# Patient Record
Sex: Male | Born: 1955 | Race: White | Hispanic: No
Health system: Southern US, Community
[De-identification: ages and names within clinical notes are randomized; demographics above are authoritative.]

---

## 2018-11-12 ENCOUNTER — Other Ambulatory Visit: Payer: Self-pay

## 2018-11-12 DIAGNOSIS — Z8249 Family history of ischemic heart disease and other diseases of the circulatory system: Secondary | ICD-10-CM

## 2018-11-12 NOTE — Progress Notes (Signed)
Ordered CT calcium score. Dr. Eden EmmsNishan to read and call results to Dr. Gwen PoundsKowalski.

## 2018-11-21 ENCOUNTER — Ambulatory Visit (INDEPENDENT_AMBULATORY_CARE_PROVIDER_SITE_OTHER)
Admission: RE | Admit: 2018-11-21 | Discharge: 2018-11-21 | Disposition: A | Discharge status: Home / Self Care | Payer: Self-pay | Source: Ambulatory Visit | Attending: Cardiovascular Disease | Admitting: Cardiovascular Disease

## 2018-11-21 DIAGNOSIS — Z8249 Family history of ischemic heart disease and other diseases of the circulatory system: Secondary | ICD-10-CM

## 2020-02-01 IMAGING — CT CT HEART SCORING
2 series · 16 of 20 positions shown, 18 images · non-contrast
Comparison: None.

Addendum:
EXAM:
OVER-READ INTERPRETATION  CT CHEST

The following report is an over-read performed by radiologist Dr.
over-read does not include interpretation of cardiac or coronary
anatomy or pathology. The calcium score interpretation by the
cardiologist is attached.
CLINICAL DATA: Risk stratification
Coronary Calcium Score
TECHNIQUE: The patient was scanned on a Siemens Somatom 64 slice scanner. Axial
non-contrast 3 mm slices were carried out through the heart. The
data set was analyzed on a dedicated work station and scored using
the Agatson method.

[Series 2: casc 3.0 i36f 2 bestdiast 68 % · axial · 0.37mm/px · z∈[-281,-158]mm · 8 of 53 slices shown, 10 images]
[im 6/53  vessel]
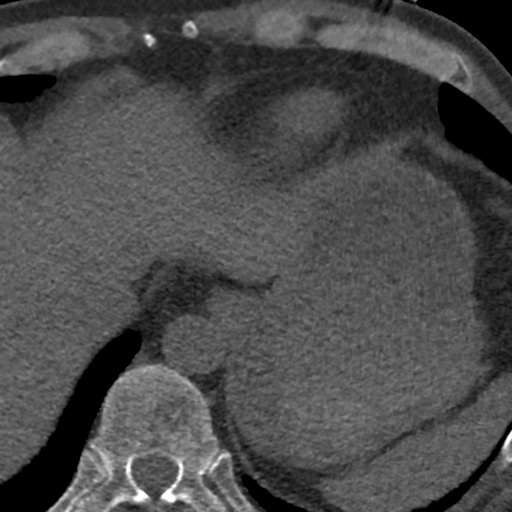
[im 6/53  lung]
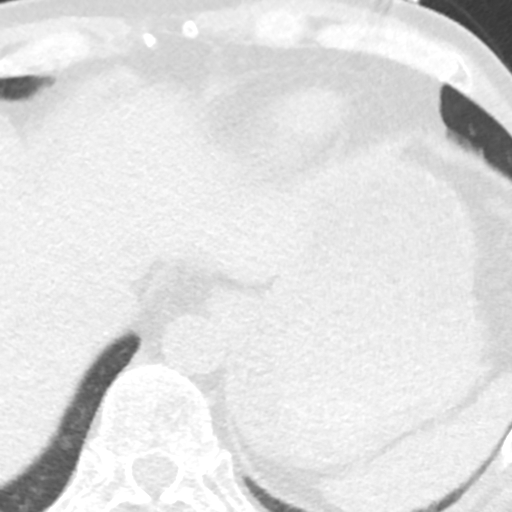
[im 12/53  vessel]
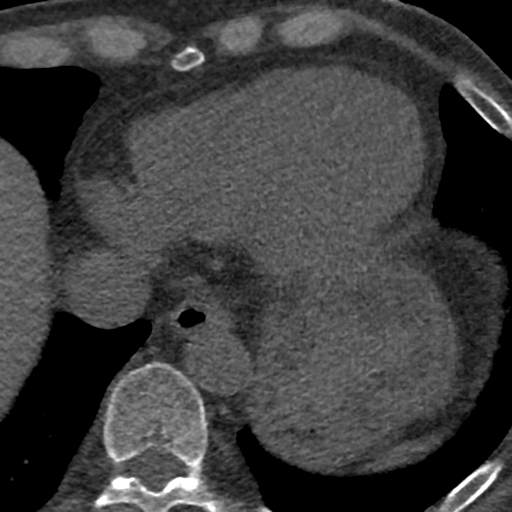
[im 18/53  vessel]
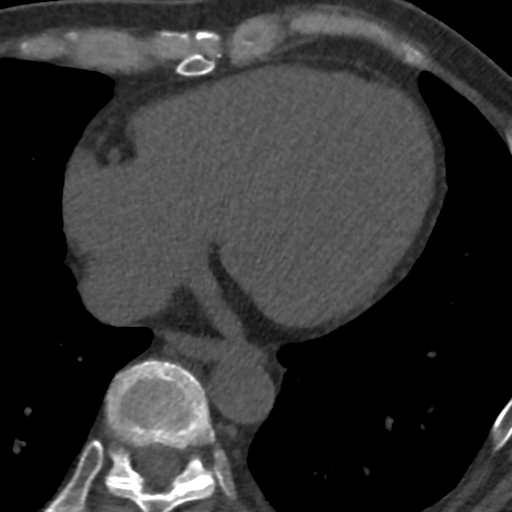
[im 24/53  vessel]
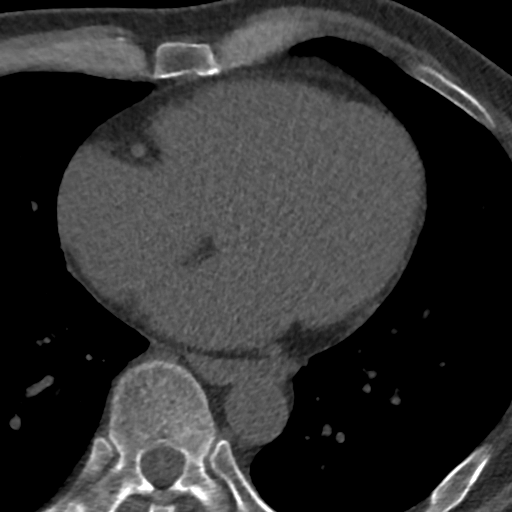
[im 29/53  vessel]
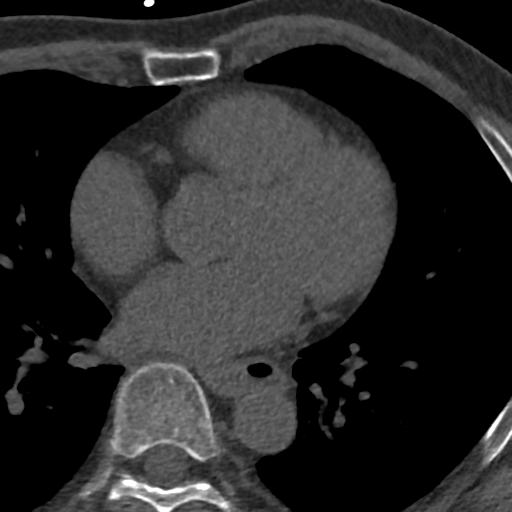
[im 29/53  lung]
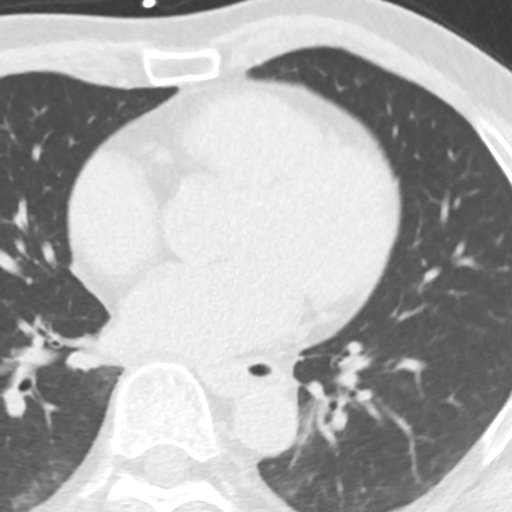
[im 35/53  vessel]
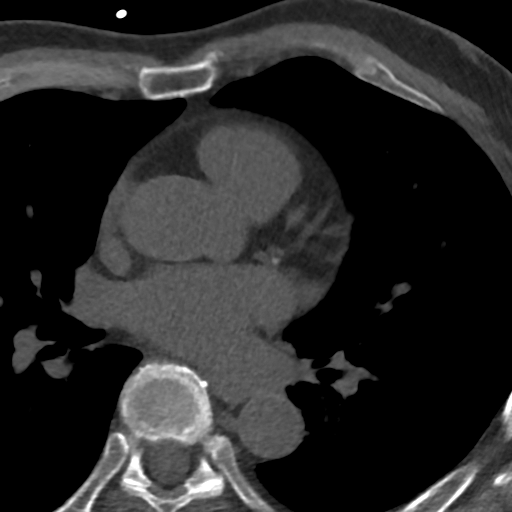
[im 41/53  vessel]
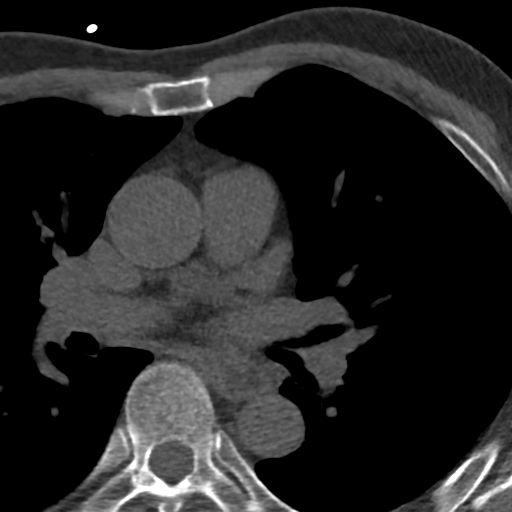
[im 47/53  vessel]
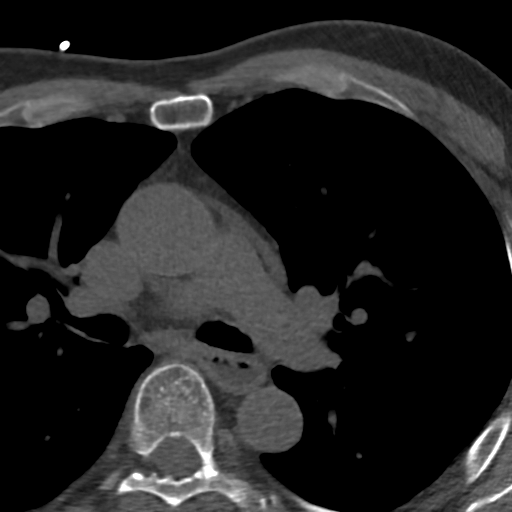

[Series 4: lung st 69 % · axial · 0.68mm/px · z∈[-281,-158]mm · 8 of 53 slices shown]
[im 6/53  lung]
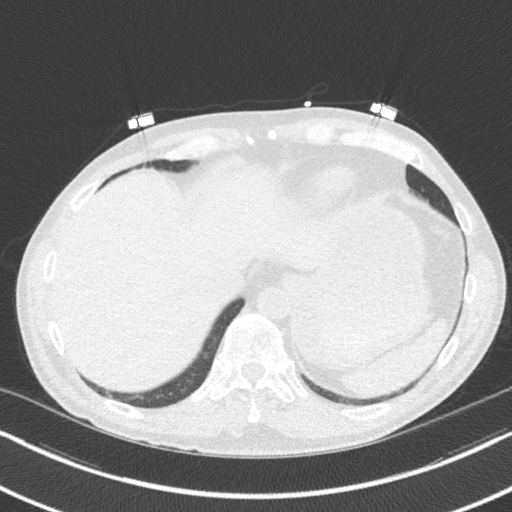
[im 12/53  lung]
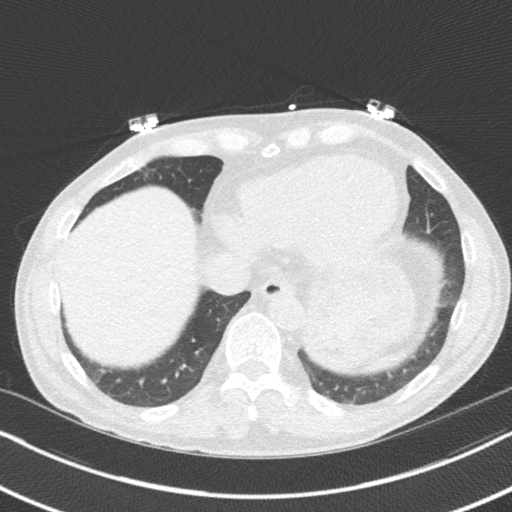
[im 18/53  lung]
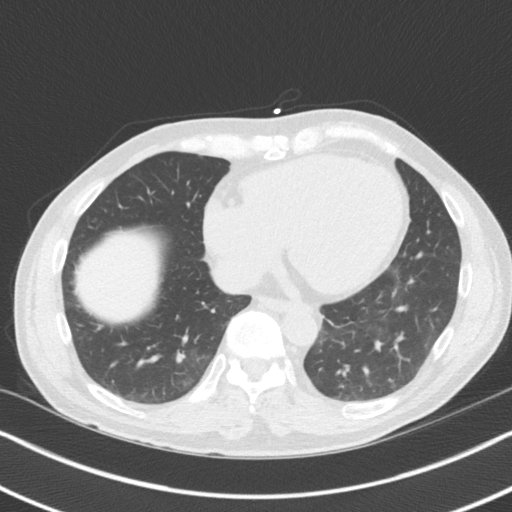
[im 24/53  lung]
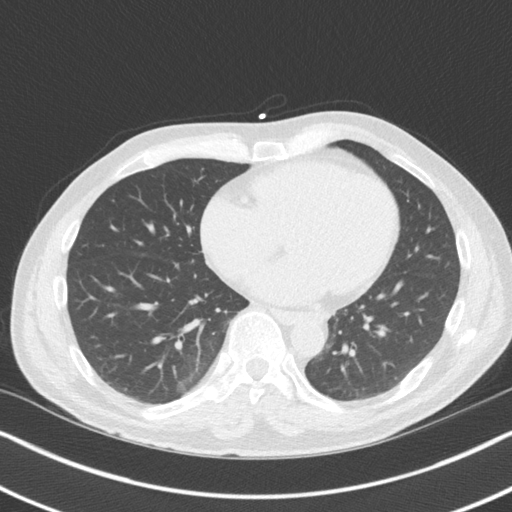
[im 29/53  lung]
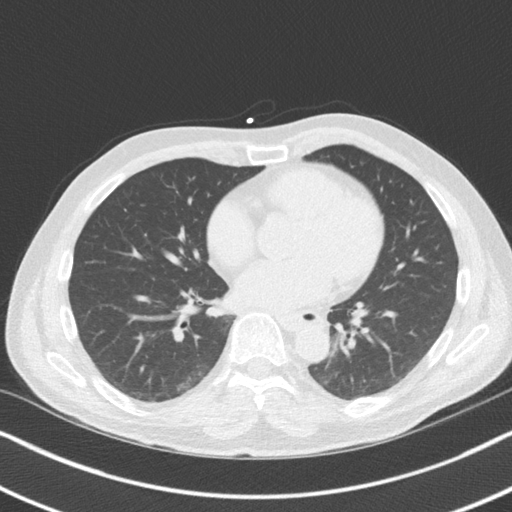
[im 35/53  lung]
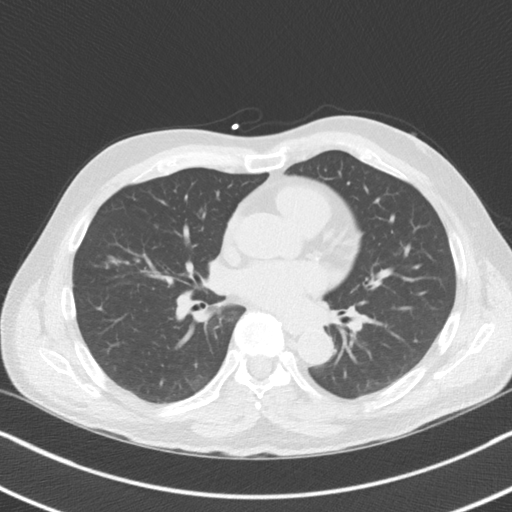
[im 41/53  lung]
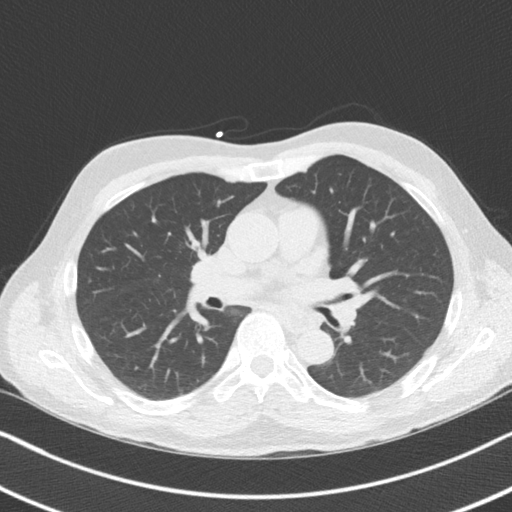
[im 47/53  lung]
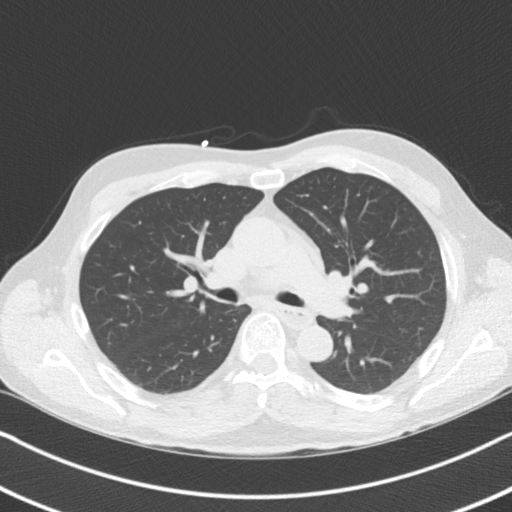

[16 of 20 positions shown; findings below may reference images not displayed]

FINDINGS: Vascular: Aortic atherosclerosis.

Mediastinum/Nodes: No imaged thoracic adenopathy. The esophagus is
mildly dilated with fluid level within, including on image [DATE].

Lungs/Pleura: No imaged pleural fluid.

3 mm left lower lobe pulmonary nodule on image 40/3. Mild right
middle lobe scarring is evidenced by peribronchovascular
interstitial thickening.

Upper Abdomen: Normal imaged portions of the liver, spleen, stomach.

Musculoskeletal: Remote posterior left rib fractures.
IMPRESSION: 1.  No acute findings in the imaged extracardiac chest.
2. 3 mm left lower lobe pulmonary nodule. No follow-up needed if
patient is low-risk. Non-contrast chest CT can be considered in 12
months if patient is high-risk. This recommendation follows the
consensus statement: Guidelines for Management of Incidental
Pulmonary Nodules Detected on CT Images: From the [HOSPITAL]
3. Esophageal air fluid level suggests dysmotility or
gastroesophageal reflux.
4.  Aortic Atherosclerosis ([id number]-EGL.L).
FINDINGS: Non-cardiac: See separate report from [REDACTED].

Ascending aorta: Normal diameter 3.3 cm

Pericardium: Normal

Coronary arteries: Calcium noted in proximal LAD and circumflex
arteries
IMPRESSION: Coronary calcium score of 114. This was 65 th percentile for age and
sex matched control.

Mostyn Adu

*** End of Addendum ***

## 2021-09-09 ENCOUNTER — Other Ambulatory Visit: Payer: Self-pay

## 2021-09-09 ENCOUNTER — Ambulatory Visit (INDEPENDENT_AMBULATORY_CARE_PROVIDER_SITE_OTHER): Payer: Medicare Other | Admitting: Dermatology

## 2021-09-09 DIAGNOSIS — L814 Other melanin hyperpigmentation: Secondary | ICD-10-CM

## 2021-09-09 DIAGNOSIS — Z1283 Encounter for screening for malignant neoplasm of skin: Secondary | ICD-10-CM

## 2021-09-09 DIAGNOSIS — L821 Other seborrheic keratosis: Secondary | ICD-10-CM | POA: Diagnosis not present

## 2021-09-09 DIAGNOSIS — L820 Inflamed seborrheic keratosis: Secondary | ICD-10-CM | POA: Diagnosis not present

## 2021-09-09 DIAGNOSIS — L570 Actinic keratosis: Secondary | ICD-10-CM

## 2021-09-09 DIAGNOSIS — L578 Other skin changes due to chronic exposure to nonionizing radiation: Secondary | ICD-10-CM | POA: Diagnosis not present

## 2021-09-09 DIAGNOSIS — H61002 Unspecified perichondritis of left external ear: Secondary | ICD-10-CM | POA: Diagnosis not present

## 2021-09-09 NOTE — Progress Notes (Signed)
New Patient Visit  Subjective  Jonathan Lang is a 65 y.o. male who presents for the following: check spots on ears (Bil ears, 15m, rough scaly areas) and Upper body skin exam (Pt declines TBSE today). The patient presents for Upper Body Skin Exam (UBSE) for skin cancer screening and mole check.  The following portions of the chart were reviewed this encounter and updated as appropriate:   Allergies  Meds  Problems  Med Hx  Surg Hx  Fam Hx     Review of Systems:  No other skin or systemic complaints except as noted in HPI or Assessment and Plan.  Objective  Well appearing patient in no apparent distress; mood and affect are within normal limits.  A focused examination was performed including upper body skin exam including legs. Relevant physical exam findings are noted in the Assessment and Plan.  face, ears x 30 (30) Pink scaly macules   Left Ear Irregular cartilage  Left Forehead x 1, L ear crus x 1 Total = 2 (2) Erythematous keratotic or waxy stuck-on papule or plaque.   Assessment & Plan   Seborrheic Keratoses - Stuck-on, waxy, tan-brown papules and/or plaques  - Benign-appearing - Discussed benign etiology and prognosis. - Observe - Call for any changes  Lentigines - Scattered tan macules - Due to sun exposure - Benign-appering, observe - Recommend daily broad spectrum sunscreen SPF 30+ to sun-exposed areas, reapply every 2 hours as needed. - Call for any changes  Actinic Damage - Severe, confluent actinic changes with pre-cancerous actinic keratoses  - Severe, chronic, not at goal, secondary to cumulative UV radiation exposure over time - diffuse scaly erythematous macules and papules with underlying dyspigmentation - Discussed Prescription "Field Treatment" for Severe, Chronic Confluent Actinic Changes with Pre-Cancerous Actinic Keratoses Field treatment involves treatment of an entire area of skin that has confluent Actinic Changes (Sun/ Ultraviolet light  damage) and PreCancerous Actinic Keratoses by method of PhotoDynamic Therapy (PDT) and/or prescription Topical Chemotherapy agents such as 5-fluorouracil, 5-fluorouracil/calcipotriene, and/or imiquimod.  The purpose is to decrease the number of clinically evident and subclinical PreCancerous lesions to prevent progression to development of skin cancer by chemically destroying early precancer changes that may or may not be visible.  It has been shown to reduce the risk of developing skin cancer in the treated area. As a result of treatment, redness, scaling, crusting, and open sores may occur during treatment course. One or more than one of these methods may be used and may have to be used several times to control, suppress and eliminate the PreCancerous changes. Discussed treatment course, expected reaction, and possible side effects. - Recommend daily broad spectrum sunscreen SPF 30+ to sun-exposed areas, reapply every 2 hours as needed.  - Staying in the shade or wearing long sleeves, sun glasses (UVA+UVB protection) and wide brim hats (4-inch brim around the entire circumference of the hat) are also recommended. - Call for new or changing lesions.  - In 1 month - Start 5-fluorouracil/calcipotriene cream twice a day for 7 days to affected areas including forehead, bil temples and bil cheeks. Prescription sent to [facility]. Patient advised they will receive an email to purchase the medication online and have it sent to their home. Patient provided with handout reviewing treatment course and side effects and advised to call or message [place] on MyChart with any concerns.   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 It can be painful and be scaly or ulcerated. Discussed txt options which include: LN2, topical treatment, IL injections, shave removal; excision.  Discussed that no matter what treatment is given, it could recur.  Inflamed seborrheic keratosis Left Forehead x 1, L ear crus x 1 Total = 2  Destruction of lesion - Left Forehead x 1, L ear crus x 1 Total = 2 Complexity: simple   Destruction method: cryotherapy   Informed consent: discussed and consent obtained   Timeout:  patient name, date of birth, surgical site, and procedure verified Lesion destroyed using liquid nitrogen: Yes   Region frozen until ice ball extended beyond lesion: Yes   Outcome: patient tolerated procedure well with no complications   Post-procedure details: wound care instructions given    Return in about 8 months (around 05/09/2022) for AK f/u.  I, Ardis Rowan, RMA, am acting as scribe for Armida Sans, MD . Documentation: I have reviewed the above documentation for accuracy and completeness, and I agree with the above.  Armida Sans, MD

## 2021-09-09 NOTE — Patient Instructions (Addendum)
If you have any questions or concerns for your doctor, please call our main line at [phone] and press option 4 to reach your doctor's medical assistant. If no one answers, please leave a voicemail as directed and we will return your call as soon as possible. Messages left after 4 pm will be answered the following business day.   You may also send [place] a message via [place]. We typically respond to MyChart messages within 1-2 business days.  For prescription refills, please ask your pharmacy to contact our office. Our fax number is [phone].  If you have an urgent issue when the clinic is closed that cannot wait until the next business day, you can page your doctor at the number below.    Please note that while we do our best to be available for urgent issues outside of office hours, we are not available 24/7.   If you have an urgent issue and are unable to reach [place], you may choose to seek medical care at your doctor's office, retail clinic, urgent care center, or emergency room.  If you have a medical emergency, please immediately call 911 or go to the emergency department.  Pager Numbers  - Dr. Nehemiah Massed: [phone]  - Dr. Laurence Ferrari: [phone]  - Dr. Nicole Kindred: [phone]  In the event of inclement weather, please call our main line at [phone] for an update on the status of any delays or closures.  Dermatology Medication Tips: Please keep the boxes that topical medications come in in order to help keep track of the instructions about where and how to use these. Pharmacies typically print the medication instructions only on the boxes and not directly on the medication tubes.   If your medication is too expensive, please contact our office at [phone] option 4 or send [place] a message through [place].   We are unable to tell what your co-pay for medications will be in advance as this is different depending on your insurance coverage. However, we may be able to find a substitute  medication at lower cost or fill out paperwork to get insurance to cover a needed medication.   If a prior authorization is required to get your medication covered by your insurance company, please allow [place] 1-2 business days to complete this process.  Drug prices often vary depending on where the prescription is filled and some pharmacies may offer cheaper prices.  The website www.goodrx.com contains coupons for medications through different pharmacies. The prices here do not account for what the cost may be with help from insurance (it may be cheaper with your insurance), but the website can give you the price if you did not use any insurance.  - You can print the associated coupon and take it with your prescription to the pharmacy.  - You may also stop by our office during regular business hours and pick up a GoodRx coupon card.  - If you need your prescription sent electronically to a different pharmacy, notify our office through [facility] or by phone at [phone] option 4.   Instructions for [facility]  One or more of your medications was sent to the [facility]. You will receive an email from them and can purchase the medicine through that link. It will then be mailed to your home at the address you confirmed. If for any reason you do not receive an email from them, please check your spam folder. If you still do not find the  email, please let us know. [facility] phone number is [phone].   Start [facility] cream to face in 4 to 8 weeks.

## 2021-09-12 ENCOUNTER — Encounter: Payer: Self-pay | Admitting: Dermatology

## 2022-05-09 ENCOUNTER — Ambulatory Visit: Payer: Medicare Other | Admitting: Dermatology
